# Patient Record
Sex: Female | Born: 1965 | Race: White | Hispanic: No | Marital: Single
Health system: Southern US, Community
[De-identification: ages and names within clinical notes are randomized; demographics above are authoritative.]

## PROBLEM LIST (undated history)

## (undated) HISTORY — PX: BREAST EXCISIONAL BIOPSY: SUR124

---

## 1998-05-25 ENCOUNTER — Encounter: Admission: RE | Admit: 1998-05-25 | Discharge: 1998-05-25 | Payer: Self-pay | Admitting: Family Medicine

## 1998-10-27 ENCOUNTER — Emergency Department (HOSPITAL_COMMUNITY): Admission: EM | Admit: 1998-10-27 | Discharge: 1998-10-27 | Payer: Self-pay | Admitting: Emergency Medicine

## 2004-06-28 ENCOUNTER — Emergency Department (HOSPITAL_COMMUNITY): Admission: AC | Admit: 2004-06-28 | Discharge: 2004-06-28 | Payer: Self-pay

## 2004-12-21 ENCOUNTER — Encounter: Admission: RE | Admit: 2004-12-21 | Discharge: 2004-12-21 | Payer: Self-pay | Admitting: Obstetrics and Gynecology

## 2006-02-02 ENCOUNTER — Encounter: Admission: RE | Admit: 2006-02-02 | Discharge: 2006-02-02 | Payer: Self-pay | Admitting: Family Medicine

## 2006-02-08 ENCOUNTER — Other Ambulatory Visit: Admission: RE | Admit: 2006-02-08 | Discharge: 2006-02-08 | Payer: Self-pay | Admitting: Family Medicine

## 2007-02-05 ENCOUNTER — Encounter: Admission: RE | Admit: 2007-02-05 | Discharge: 2007-02-05 | Payer: Self-pay | Admitting: Family Medicine

## 2007-02-22 ENCOUNTER — Encounter: Admission: RE | Admit: 2007-02-22 | Discharge: 2007-02-22 | Payer: Self-pay | Admitting: Family Medicine

## 2007-04-03 ENCOUNTER — Other Ambulatory Visit: Admission: RE | Admit: 2007-04-03 | Discharge: 2007-04-03 | Payer: Self-pay | Admitting: Family Medicine

## 2007-06-22 ENCOUNTER — Ambulatory Visit: Payer: Self-pay | Admitting: Sports Medicine

## 2007-06-22 DIAGNOSIS — M79609 Pain in unspecified limb: Secondary | ICD-10-CM | POA: Insufficient documentation

## 2007-07-31 ENCOUNTER — Encounter: Admission: RE | Admit: 2007-07-31 | Discharge: 2007-07-31 | Payer: Self-pay | Admitting: Family Medicine

## 2008-02-08 ENCOUNTER — Encounter: Admission: RE | Admit: 2008-02-08 | Discharge: 2008-02-08 | Payer: Self-pay | Admitting: Family Medicine

## 2008-04-16 ENCOUNTER — Other Ambulatory Visit: Admission: RE | Admit: 2008-04-16 | Discharge: 2008-04-16 | Payer: Self-pay | Admitting: Family Medicine

## 2009-02-09 ENCOUNTER — Encounter: Admission: RE | Admit: 2009-02-09 | Discharge: 2009-02-09 | Payer: Self-pay | Admitting: Family Medicine

## 2009-06-12 ENCOUNTER — Other Ambulatory Visit: Admission: RE | Admit: 2009-06-12 | Discharge: 2009-06-12 | Payer: Self-pay | Admitting: Family Medicine

## 2010-03-09 ENCOUNTER — Encounter: Admission: RE | Admit: 2010-03-09 | Discharge: 2010-03-09 | Payer: Self-pay | Admitting: Family Medicine

## 2010-06-22 ENCOUNTER — Other Ambulatory Visit: Admission: RE | Admit: 2010-06-22 | Discharge: 2010-06-22 | Payer: Self-pay | Admitting: Family Medicine

## 2010-09-25 ENCOUNTER — Emergency Department (HOSPITAL_BASED_OUTPATIENT_CLINIC_OR_DEPARTMENT_OTHER): Admission: EM | Admit: 2010-09-25 | Discharge: 2010-09-25 | Payer: Self-pay | Admitting: Emergency Medicine

## 2011-02-18 ENCOUNTER — Other Ambulatory Visit: Payer: Self-pay | Admitting: Family Medicine

## 2011-02-18 DIAGNOSIS — Z1231 Encounter for screening mammogram for malignant neoplasm of breast: Secondary | ICD-10-CM

## 2011-03-14 ENCOUNTER — Ambulatory Visit: Payer: Self-pay

## 2011-03-21 ENCOUNTER — Ambulatory Visit
Admission: RE | Admit: 2011-03-21 | Discharge: 2011-03-21 | Disposition: A | Discharge status: Home / Self Care | Payer: BC Managed Care – PPO | Source: Ambulatory Visit | Attending: Family Medicine | Admitting: Family Medicine

## 2011-03-21 DIAGNOSIS — Z1231 Encounter for screening mammogram for malignant neoplasm of breast: Secondary | ICD-10-CM

## 2011-08-10 ENCOUNTER — Other Ambulatory Visit: Payer: Self-pay | Admitting: Family Medicine

## 2011-08-10 ENCOUNTER — Other Ambulatory Visit (HOSPITAL_COMMUNITY)
Admission: RE | Admit: 2011-08-10 | Discharge: 2011-08-10 | Disposition: A | Discharge status: Home / Self Care | Payer: BC Managed Care – PPO | Source: Ambulatory Visit | Attending: Family Medicine | Admitting: Family Medicine

## 2011-08-10 DIAGNOSIS — Z124 Encounter for screening for malignant neoplasm of cervix: Secondary | ICD-10-CM | POA: Insufficient documentation

## 2012-04-23 ENCOUNTER — Other Ambulatory Visit: Payer: Self-pay | Admitting: Family Medicine

## 2012-04-23 DIAGNOSIS — Z1231 Encounter for screening mammogram for malignant neoplasm of breast: Secondary | ICD-10-CM

## 2012-04-30 ENCOUNTER — Ambulatory Visit
Admission: RE | Admit: 2012-04-30 | Discharge: 2012-04-30 | Disposition: A | Discharge status: Home / Self Care | Payer: BC Managed Care – PPO | Source: Ambulatory Visit | Attending: Family Medicine | Admitting: Family Medicine

## 2012-04-30 DIAGNOSIS — Z1231 Encounter for screening mammogram for malignant neoplasm of breast: Secondary | ICD-10-CM

## 2013-05-22 ENCOUNTER — Other Ambulatory Visit: Payer: Self-pay

## 2013-05-22 DIAGNOSIS — Z1231 Encounter for screening mammogram for malignant neoplasm of breast: Secondary | ICD-10-CM

## 2013-06-19 ENCOUNTER — Ambulatory Visit
Admission: RE | Admit: 2013-06-19 | Discharge: 2013-06-19 | Disposition: A | Discharge status: Home / Self Care | Payer: BC Managed Care – PPO | Source: Ambulatory Visit

## 2013-06-19 DIAGNOSIS — Z1231 Encounter for screening mammogram for malignant neoplasm of breast: Secondary | ICD-10-CM

## 2014-06-17 ENCOUNTER — Other Ambulatory Visit: Payer: Self-pay

## 2014-06-17 DIAGNOSIS — Z1231 Encounter for screening mammogram for malignant neoplasm of breast: Secondary | ICD-10-CM

## 2014-07-08 ENCOUNTER — Ambulatory Visit
Admission: RE | Admit: 2014-07-08 | Discharge: 2014-07-08 | Disposition: A | Discharge status: Home / Self Care | Payer: BC Managed Care – PPO | Source: Ambulatory Visit

## 2014-07-08 DIAGNOSIS — Z1231 Encounter for screening mammogram for malignant neoplasm of breast: Secondary | ICD-10-CM

## 2014-07-09 ENCOUNTER — Other Ambulatory Visit: Payer: Self-pay | Admitting: Family Medicine

## 2014-07-09 DIAGNOSIS — R928 Other abnormal and inconclusive findings on diagnostic imaging of breast: Secondary | ICD-10-CM

## 2014-07-14 ENCOUNTER — Encounter (INDEPENDENT_AMBULATORY_CARE_PROVIDER_SITE_OTHER): Payer: Self-pay

## 2014-07-14 ENCOUNTER — Ambulatory Visit
Admission: RE | Admit: 2014-07-14 | Discharge: 2014-07-14 | Disposition: A | Discharge status: Home / Self Care | Payer: BC Managed Care – PPO | Source: Ambulatory Visit | Attending: Family Medicine | Admitting: Family Medicine

## 2014-07-14 DIAGNOSIS — R928 Other abnormal and inconclusive findings on diagnostic imaging of breast: Secondary | ICD-10-CM

## 2014-12-12 ENCOUNTER — Other Ambulatory Visit: Payer: Self-pay | Admitting: Family Medicine

## 2014-12-12 DIAGNOSIS — N63 Unspecified lump in unspecified breast: Secondary | ICD-10-CM

## 2014-12-17 ENCOUNTER — Other Ambulatory Visit: Payer: Self-pay | Admitting: Family Medicine

## 2014-12-17 ENCOUNTER — Other Ambulatory Visit (HOSPITAL_COMMUNITY)
Admission: RE | Admit: 2014-12-17 | Discharge: 2014-12-17 | Disposition: A | Discharge status: Home / Self Care | Payer: BC Managed Care – PPO | Source: Ambulatory Visit | Attending: Family Medicine | Admitting: Family Medicine

## 2014-12-17 DIAGNOSIS — Z1151 Encounter for screening for human papillomavirus (HPV): Secondary | ICD-10-CM | POA: Diagnosis present

## 2014-12-17 DIAGNOSIS — Z124 Encounter for screening for malignant neoplasm of cervix: Secondary | ICD-10-CM | POA: Diagnosis present

## 2014-12-18 LAB — CYTOLOGY - PAP

## 2015-01-14 ENCOUNTER — Ambulatory Visit
Admission: RE | Admit: 2015-01-14 | Discharge: 2015-01-14 | Disposition: A | Discharge status: Home / Self Care | Payer: BC Managed Care – PPO | Source: Ambulatory Visit | Attending: Family Medicine | Admitting: Family Medicine

## 2015-01-14 DIAGNOSIS — N63 Unspecified lump in unspecified breast: Secondary | ICD-10-CM

## 2015-01-16 ENCOUNTER — Other Ambulatory Visit: Payer: BC Managed Care – PPO

## 2015-06-08 ENCOUNTER — Other Ambulatory Visit: Payer: Self-pay | Admitting: Family Medicine

## 2015-06-08 DIAGNOSIS — N6320 Unspecified lump in the left breast, unspecified quadrant: Secondary | ICD-10-CM

## 2015-07-21 ENCOUNTER — Ambulatory Visit
Admission: RE | Admit: 2015-07-21 | Discharge: 2015-07-21 | Disposition: A | Discharge status: Home / Self Care | Payer: BC Managed Care – PPO | Source: Ambulatory Visit | Attending: Family Medicine | Admitting: Family Medicine

## 2015-07-21 DIAGNOSIS — N6320 Unspecified lump in the left breast, unspecified quadrant: Secondary | ICD-10-CM

## 2016-07-04 ENCOUNTER — Other Ambulatory Visit: Payer: Self-pay | Admitting: Family Medicine

## 2016-07-04 DIAGNOSIS — N6320 Unspecified lump in the left breast, unspecified quadrant: Secondary | ICD-10-CM

## 2016-08-02 ENCOUNTER — Ambulatory Visit
Admission: RE | Admit: 2016-08-02 | Discharge: 2016-08-02 | Disposition: A | Discharge status: Home / Self Care | Payer: BC Managed Care – PPO | Source: Ambulatory Visit | Attending: Family Medicine | Admitting: Family Medicine

## 2016-08-02 DIAGNOSIS — N6320 Unspecified lump in the left breast, unspecified quadrant: Secondary | ICD-10-CM

## 2017-01-02 ENCOUNTER — Other Ambulatory Visit: Payer: Self-pay | Admitting: Family Medicine

## 2017-01-02 ENCOUNTER — Other Ambulatory Visit (HOSPITAL_COMMUNITY)
Admission: RE | Admit: 2017-01-02 | Discharge: 2017-01-02 | Disposition: A | Discharge status: Home / Self Care | Payer: BC Managed Care – PPO | Source: Ambulatory Visit | Attending: Family Medicine | Admitting: Family Medicine

## 2017-01-02 DIAGNOSIS — Z01411 Encounter for gynecological examination (general) (routine) with abnormal findings: Secondary | ICD-10-CM | POA: Diagnosis present

## 2017-01-02 DIAGNOSIS — Z1151 Encounter for screening for human papillomavirus (HPV): Secondary | ICD-10-CM | POA: Insufficient documentation

## 2017-01-04 LAB — CYTOLOGY - PAP
DIAGNOSIS: NEGATIVE
HPV (WINDOPATH): NOT DETECTED

## 2017-07-27 ENCOUNTER — Other Ambulatory Visit: Payer: Self-pay | Admitting: Family Medicine

## 2017-07-27 DIAGNOSIS — N6310 Unspecified lump in the right breast, unspecified quadrant: Secondary | ICD-10-CM

## 2017-08-08 ENCOUNTER — Other Ambulatory Visit: Payer: Self-pay | Admitting: Family Medicine

## 2017-08-08 ENCOUNTER — Ambulatory Visit
Admission: RE | Admit: 2017-08-08 | Discharge: 2017-08-08 | Disposition: A | Discharge status: Home / Self Care | Payer: BC Managed Care – PPO | Source: Ambulatory Visit | Attending: Family Medicine | Admitting: Family Medicine

## 2017-08-08 DIAGNOSIS — N6320 Unspecified lump in the left breast, unspecified quadrant: Secondary | ICD-10-CM

## 2017-08-08 DIAGNOSIS — N6310 Unspecified lump in the right breast, unspecified quadrant: Secondary | ICD-10-CM

## 2018-07-03 ENCOUNTER — Other Ambulatory Visit: Payer: Self-pay | Admitting: Family Medicine

## 2018-07-03 DIAGNOSIS — Z1231 Encounter for screening mammogram for malignant neoplasm of breast: Secondary | ICD-10-CM

## 2018-08-24 ENCOUNTER — Ambulatory Visit
Admission: RE | Admit: 2018-08-24 | Discharge: 2018-08-24 | Disposition: A | Discharge status: Home / Self Care | Payer: BC Managed Care – PPO | Source: Ambulatory Visit | Attending: Family Medicine | Admitting: Family Medicine

## 2018-08-24 DIAGNOSIS — Z1231 Encounter for screening mammogram for malignant neoplasm of breast: Secondary | ICD-10-CM

## 2019-08-13 ENCOUNTER — Other Ambulatory Visit: Payer: Self-pay | Admitting: Family Medicine

## 2019-08-13 DIAGNOSIS — Z1231 Encounter for screening mammogram for malignant neoplasm of breast: Secondary | ICD-10-CM

## 2019-09-25 ENCOUNTER — Ambulatory Visit
Admission: RE | Admit: 2019-09-25 | Discharge: 2019-09-25 | Disposition: A | Discharge status: Home / Self Care | Payer: BC Managed Care – PPO | Source: Ambulatory Visit | Attending: Family Medicine | Admitting: Family Medicine

## 2019-09-25 ENCOUNTER — Other Ambulatory Visit: Payer: Self-pay

## 2019-09-25 DIAGNOSIS — Z1231 Encounter for screening mammogram for malignant neoplasm of breast: Secondary | ICD-10-CM

## 2020-03-12 ENCOUNTER — Other Ambulatory Visit: Payer: Self-pay | Admitting: Family Medicine

## 2020-03-12 DIAGNOSIS — E2839 Other primary ovarian failure: Secondary | ICD-10-CM

## 2020-04-22 ENCOUNTER — Ambulatory Visit
Admission: RE | Admit: 2020-04-22 | Discharge: 2020-04-22 | Disposition: A | Discharge status: Home / Self Care | Payer: BC Managed Care – PPO | Source: Ambulatory Visit | Attending: Family Medicine | Admitting: Family Medicine

## 2020-04-22 ENCOUNTER — Other Ambulatory Visit: Payer: Self-pay

## 2020-04-22 DIAGNOSIS — E2839 Other primary ovarian failure: Secondary | ICD-10-CM

## 2020-11-04 ENCOUNTER — Other Ambulatory Visit: Payer: Self-pay

## 2020-11-04 ENCOUNTER — Other Ambulatory Visit: Payer: Self-pay | Admitting: Family Medicine

## 2020-11-04 ENCOUNTER — Ambulatory Visit
Admission: RE | Admit: 2020-11-04 | Discharge: 2020-11-04 | Disposition: A | Discharge status: Home / Self Care | Payer: BC Managed Care – PPO | Source: Ambulatory Visit | Attending: Family Medicine | Admitting: Family Medicine

## 2020-11-04 DIAGNOSIS — Z1231 Encounter for screening mammogram for malignant neoplasm of breast: Secondary | ICD-10-CM

## 2022-01-31 ENCOUNTER — Other Ambulatory Visit: Payer: Self-pay | Admitting: Family Medicine

## 2022-01-31 DIAGNOSIS — Z1231 Encounter for screening mammogram for malignant neoplasm of breast: Secondary | ICD-10-CM

## 2022-02-04 ENCOUNTER — Ambulatory Visit
Admission: RE | Admit: 2022-02-04 | Discharge: 2022-02-04 | Disposition: A | Discharge status: Home / Self Care | Payer: BC Managed Care – PPO | Source: Ambulatory Visit | Attending: Family Medicine | Admitting: Family Medicine

## 2022-02-04 ENCOUNTER — Other Ambulatory Visit: Payer: Self-pay

## 2022-02-04 DIAGNOSIS — Z1231 Encounter for screening mammogram for malignant neoplasm of breast: Secondary | ICD-10-CM

## 2022-02-08 ENCOUNTER — Other Ambulatory Visit: Payer: Self-pay | Admitting: Family Medicine

## 2022-02-08 DIAGNOSIS — R928 Other abnormal and inconclusive findings on diagnostic imaging of breast: Secondary | ICD-10-CM

## 2022-03-07 ENCOUNTER — Ambulatory Visit
Admission: RE | Admit: 2022-03-07 | Discharge: 2022-03-07 | Disposition: A | Discharge status: Home / Self Care | Payer: BC Managed Care – PPO | Source: Ambulatory Visit | Attending: Family Medicine | Admitting: Family Medicine

## 2022-03-07 ENCOUNTER — Ambulatory Visit: Payer: BC Managed Care – PPO

## 2022-03-07 DIAGNOSIS — R928 Other abnormal and inconclusive findings on diagnostic imaging of breast: Secondary | ICD-10-CM

## 2022-05-02 ENCOUNTER — Other Ambulatory Visit: Payer: Self-pay | Admitting: Family Medicine

## 2022-05-02 ENCOUNTER — Other Ambulatory Visit (HOSPITAL_COMMUNITY)
Admission: RE | Admit: 2022-05-02 | Discharge: 2022-05-02 | Disposition: A | Discharge status: Home / Self Care | Payer: BC Managed Care – PPO | Source: Ambulatory Visit | Attending: Family Medicine | Admitting: Family Medicine

## 2022-05-02 DIAGNOSIS — Z01411 Encounter for gynecological examination (general) (routine) with abnormal findings: Secondary | ICD-10-CM | POA: Insufficient documentation

## 2022-05-04 LAB — CYTOLOGY - PAP
Comment: NEGATIVE
Diagnosis: NEGATIVE
High risk HPV: NEGATIVE

## 2023-03-20 ENCOUNTER — Other Ambulatory Visit: Payer: Self-pay | Admitting: Family Medicine

## 2023-03-20 DIAGNOSIS — Z1231 Encounter for screening mammogram for malignant neoplasm of breast: Secondary | ICD-10-CM

## 2023-05-03 ENCOUNTER — Ambulatory Visit
Admission: RE | Admit: 2023-05-03 | Discharge: 2023-05-03 | Disposition: A | Discharge status: Home / Self Care | Payer: BC Managed Care – PPO | Source: Ambulatory Visit | Attending: Family Medicine | Admitting: Family Medicine

## 2023-05-03 DIAGNOSIS — Z1231 Encounter for screening mammogram for malignant neoplasm of breast: Secondary | ICD-10-CM

## 2023-05-04 ENCOUNTER — Other Ambulatory Visit: Payer: Self-pay | Admitting: Family Medicine

## 2023-05-04 DIAGNOSIS — E2839 Other primary ovarian failure: Secondary | ICD-10-CM

## 2023-08-06 IMAGING — MG MM DIGITAL SCREENING BILAT W/ TOMO AND CAD
6 of 10 series · 6 of 30 positions shown · non-contrast
Comparison: Previous exam(s).
COMPARISON: Previous exam(s).

Addendum:
CLINICAL DATA: Screening.

EXAM:
DIGITAL SCREENING BILATERAL MAMMOGRAM WITH TOMOSYNTHESIS AND CAD
TECHNIQUE: Bilateral screening digital craniocaudal and mediolateral oblique
mammograms were obtained. Bilateral screening digital breast
tomosynthesis was performed. The images were evaluated with
computer-aided detection.

[L XCCL synth-2D]
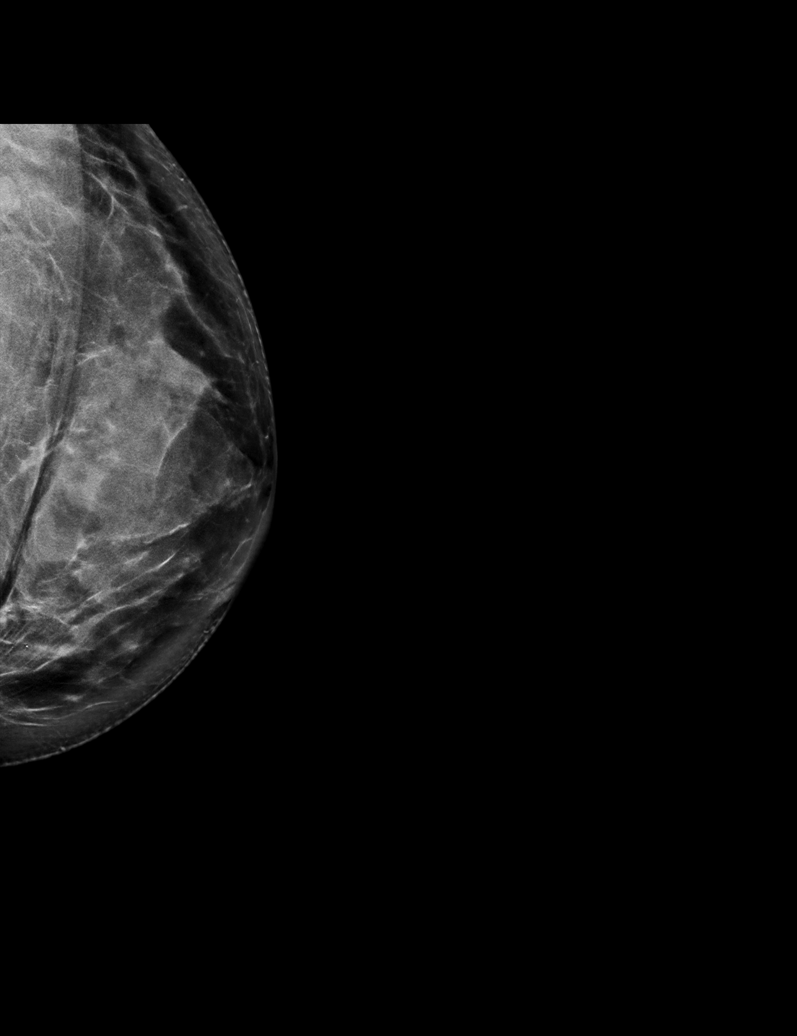

[L CC synth-2D]
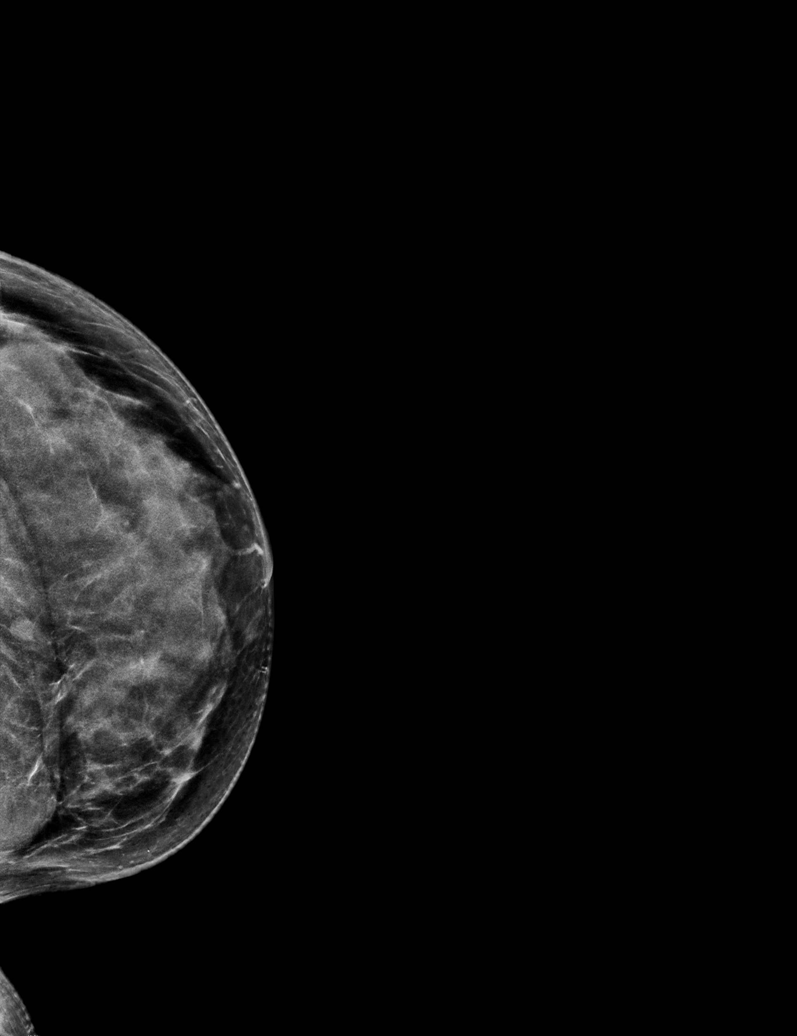

[R CC synth-2D]
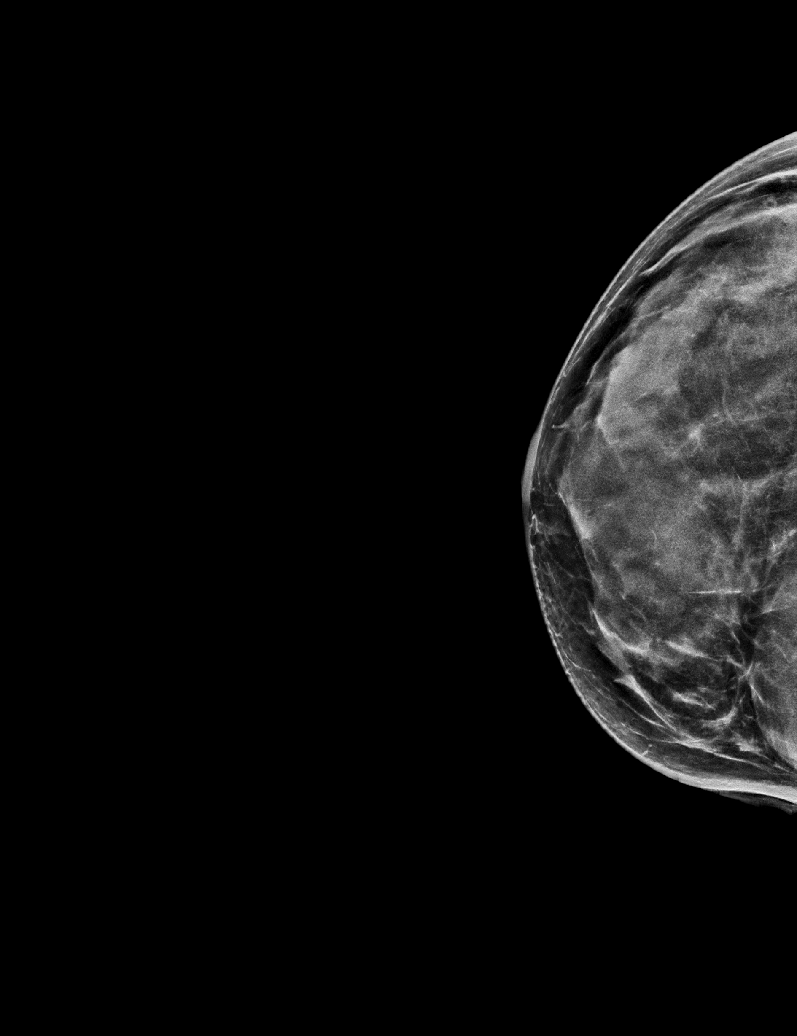

[R MLO synth-2D]
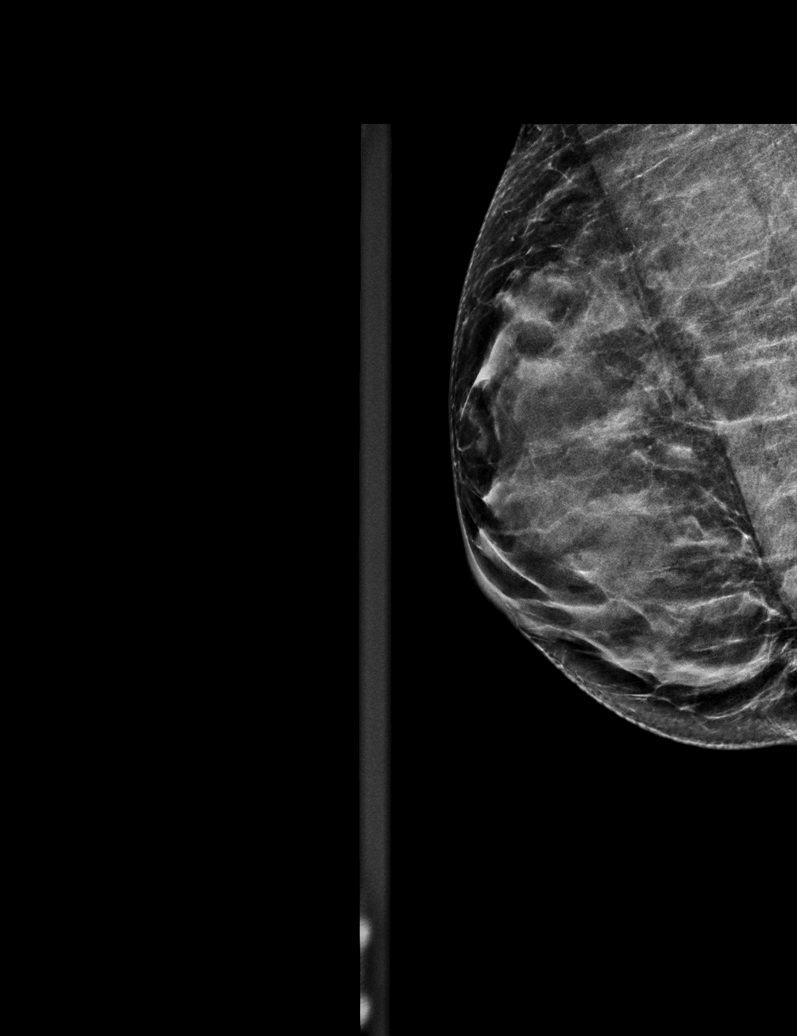

[L MLO synth-2D]
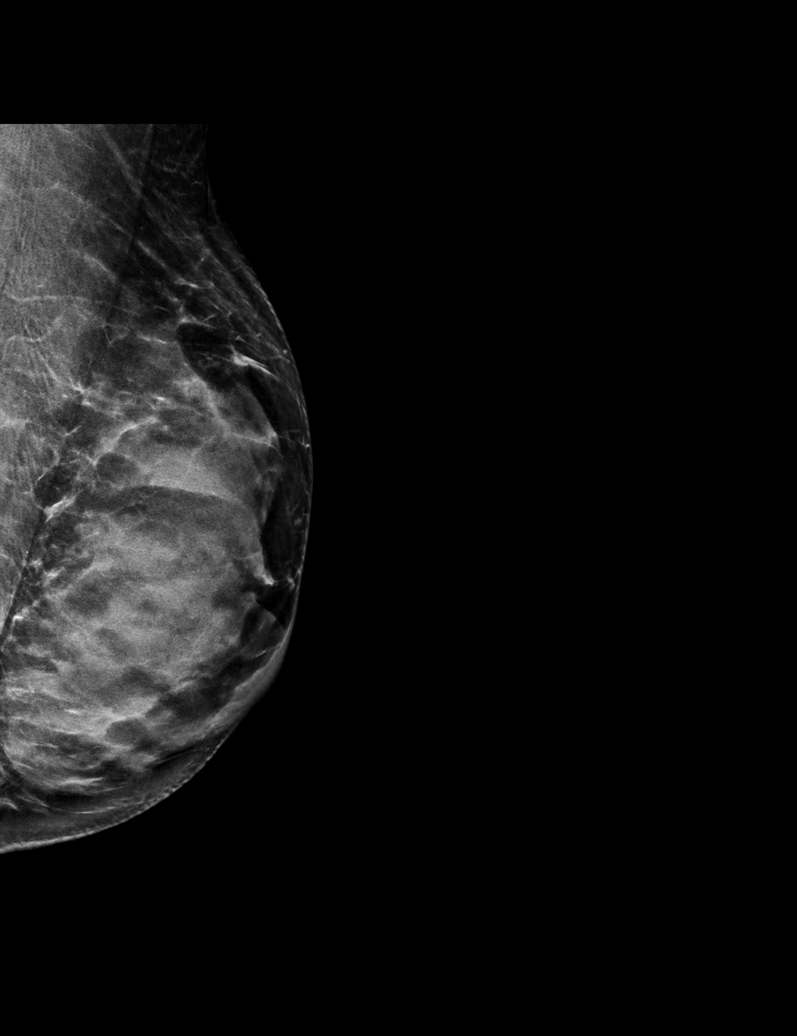

[L XCCL tomo · tomo slice 34/67.0]
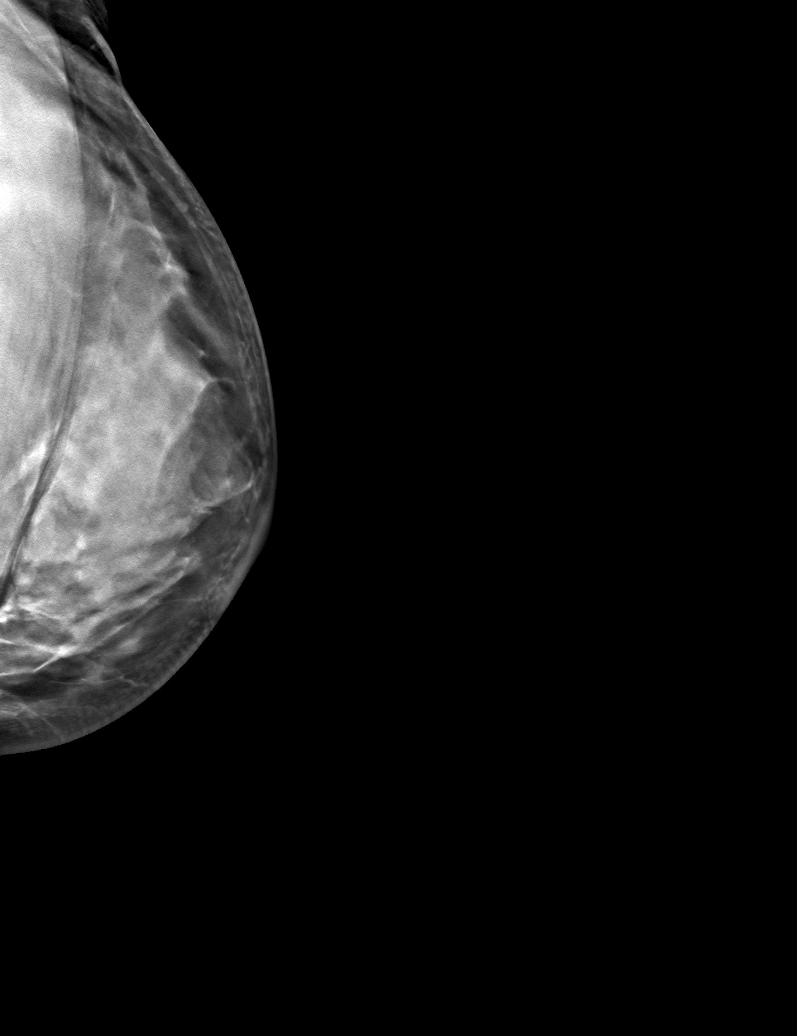

[6 of 30 positions shown; findings below may reference images not displayed]

ACR Breast Density Category d: The breast tissue is extremely dense,
which lowers the sensitivity of mammography.
FINDINGS: In the left breast, a possible mass warrants further evaluation. In
the right breast, no findings suspicious for malignancy.
IMPRESSION: Further evaluation is suggested for a possible mass in the left
breast.

RECOMMENDATION:
Diagnostic mammogram and possibly ultrasound of the left breast.
(Code:VR-F-XXA)

The patient will be contacted regarding the findings, and additional
imaging will be scheduled.

BI-RADS CATEGORY  0: Incomplete. Need additional imaging evaluation
and/or prior mammograms for comparison.

ADDENDUM:
More remote prior mammograms have been loaded for comparison. The
patient was recalled from a screening mammogram in 2183 for this
same area in the left breast. An oval mass in the lower-inner left
breast was followed demonstrating stability over time. The size and
location of the mass followed at that time corresponds with the area
in question on the current screening mammogram.
IMPRESSION: No suspicious calcifications, masses or areas of distortion are seen
in the bilateral breasts.

Recommendation: Screening mammogram in one year.(Code:2Q-6-IGP)

BI-RADS 2: Benign.

*** End of Addendum ***
ACR Breast Density Category d: The breast tissue is extremely dense,
which lowers the sensitivity of mammography.
FINDINGS: In the left breast, a possible mass warrants further evaluation. In
the right breast, no findings suspicious for malignancy.
IMPRESSION: Further evaluation is suggested for a possible mass in the left
breast.

RECOMMENDATION:
Diagnostic mammogram and possibly ultrasound of the left breast.
(Code:VR-F-XXA)

The patient will be contacted regarding the findings, and additional
imaging will be scheduled.

BI-RADS CATEGORY  0: Incomplete. Need additional imaging evaluation
and/or prior mammograms for comparison.

## 2023-10-19 ENCOUNTER — Ambulatory Visit
Admission: RE | Admit: 2023-10-19 | Discharge: 2023-10-19 | Disposition: A | Discharge status: Home / Self Care | Payer: BC Managed Care – PPO | Source: Ambulatory Visit | Attending: Family Medicine | Admitting: Family Medicine

## 2023-10-19 DIAGNOSIS — E2839 Other primary ovarian failure: Secondary | ICD-10-CM

## 2024-06-13 ENCOUNTER — Other Ambulatory Visit: Payer: Self-pay | Admitting: Family Medicine

## 2024-06-13 DIAGNOSIS — Z1231 Encounter for screening mammogram for malignant neoplasm of breast: Secondary | ICD-10-CM

## 2024-06-25 ENCOUNTER — Ambulatory Visit
Admission: RE | Admit: 2024-06-25 | Discharge: 2024-06-25 | Disposition: A | Discharge status: Home / Self Care | Payer: Self-pay | Source: Ambulatory Visit | Attending: Family Medicine | Admitting: Family Medicine

## 2024-06-25 DIAGNOSIS — Z1231 Encounter for screening mammogram for malignant neoplasm of breast: Secondary | ICD-10-CM

## 2024-08-06 ENCOUNTER — Other Ambulatory Visit (HOSPITAL_BASED_OUTPATIENT_CLINIC_OR_DEPARTMENT_OTHER): Payer: Self-pay | Admitting: Family Medicine

## 2024-08-06 DIAGNOSIS — E785 Hyperlipidemia, unspecified: Secondary | ICD-10-CM

## 2024-08-20 ENCOUNTER — Ambulatory Visit (HOSPITAL_BASED_OUTPATIENT_CLINIC_OR_DEPARTMENT_OTHER)
Admission: RE | Admit: 2024-08-20 | Discharge: 2024-08-20 | Disposition: A | Discharge status: Home / Self Care | Payer: Self-pay | Source: Ambulatory Visit | Attending: Family Medicine | Admitting: Family Medicine

## 2024-08-20 DIAGNOSIS — E785 Hyperlipidemia, unspecified: Secondary | ICD-10-CM | POA: Insufficient documentation
